# Patient Record
Sex: Male | Born: 1980 | Race: Black or African American | Hispanic: No | Marital: Single | Smoking: Current some day smoker
Health system: Southern US, Community
[De-identification: ages and names within clinical notes are randomized; demographics above are authoritative.]

## PROBLEM LIST (undated history)

## (undated) DIAGNOSIS — I10 Essential (primary) hypertension: Secondary | ICD-10-CM

---

## 2016-11-22 ENCOUNTER — Encounter (HOSPITAL_COMMUNITY): Payer: Self-pay | Admitting: Emergency Medicine

## 2016-11-22 ENCOUNTER — Emergency Department (HOSPITAL_COMMUNITY): Payer: Self-pay

## 2016-11-22 ENCOUNTER — Emergency Department (HOSPITAL_COMMUNITY)
Admission: EM | Admit: 2016-11-22 | Discharge: 2016-11-22 | Disposition: A | Discharge status: Home / Self Care | Payer: Self-pay | Attending: Emergency Medicine | Admitting: Emergency Medicine

## 2016-11-22 DIAGNOSIS — K4090 Unilateral inguinal hernia, without obstruction or gangrene, not specified as recurrent: Secondary | ICD-10-CM | POA: Insufficient documentation

## 2016-11-22 DIAGNOSIS — N50812 Left testicular pain: Secondary | ICD-10-CM

## 2016-11-22 DIAGNOSIS — N433 Hydrocele, unspecified: Secondary | ICD-10-CM | POA: Insufficient documentation

## 2016-11-22 DIAGNOSIS — I10 Essential (primary) hypertension: Secondary | ICD-10-CM | POA: Insufficient documentation

## 2016-11-22 HISTORY — DX: Essential (primary) hypertension: I10

## 2016-11-22 LAB — URINALYSIS, ROUTINE W REFLEX MICROSCOPIC
Bilirubin Urine: NEGATIVE
Glucose, UA: NEGATIVE mg/dL
KETONES UR: NEGATIVE mg/dL
Nitrite: NEGATIVE
PH: 5 (ref 5.0–8.0)
Protein, ur: 30 mg/dL — AB
Specific Gravity, Urine: 1.018 (ref 1.005–1.030)

## 2016-11-22 MED ORDER — KETOROLAC TROMETHAMINE 60 MG/2ML IM SOLN
60.0000 mg | Freq: Once | INTRAMUSCULAR | Status: DC
  Filled 2016-11-22: qty 2

## 2016-11-22 NOTE — ED Notes (Signed)
Pt walked to the bathroom to void w/o difficulty but stated he did have some scrotal pain.

## 2016-11-22 NOTE — ED Notes (Addendum)
Pt was given d/c papers w/instructions given.  He wanted to know if [facility] would take an uninsured patient.  EDP made aware and SW was called.  By the time I went back to the room to take a final set of vitals and have pt sign for d/c, he was already gone.

## 2016-11-22 NOTE — ED Triage Notes (Signed)
Pt states that he was lifting boxes yesterday and now has pain in his L groin. Alert and oriented.

## 2016-11-22 NOTE — Discharge Instructions (Signed)
You have a left inguinal hernia. You can try manually pushing the fat up into the inguinal canal. You can apply ice and elevate your scrotum. Avoid heavy lifting. Follow up with general surgery for repair. Return if unable to have a bowel movement, if vomiting, fever over 100.5, any new symptoms.

## 2016-11-22 NOTE — ED Provider Notes (Signed)
[facility]-EMERGENCY DEPT Provider Note   CSN: [id number] Arrival date & time: 11/22/16  1457     History   Chief Complaint Chief Complaint  Patient presents with  . Groin Pain    HPI Jesse Hill is a 36 y.o. male.  HPI  Jesse Hill is a 36 y.o. malewith history of hypertension, presents to emergency department complaining of left groin and left testicular pain. Patient states that he was working out yesterday, states no pain while working out, states about an hour later he developed a "twinge" in his left groin. Since then increasing pain, worse today. Patient has also noticed pain in his left testicle. He reports mild swelling. Denies any dysuria, hematuria, urinary frequency or urgency. No canal discharge. Last bowel movement yesterday and normal. No nausea or vomiting. Denies any fever or chills. Pain is worsened with movement, and nothing is making it better. No history of the same.   Past Medical History:  Diagnosis Date  . Hypertension     There are no active problems to display for this patient.   No past surgical history on file.     Home Medications    Prior to Admission medications   Not on File    Family History History reviewed. No pertinent family history.  Social History Social History  Substance Use Topics  . Smoking status: Not on file  . Smokeless tobacco: Not on file  . Alcohol use Not on file     Allergies   Patient has no known allergies.   Review of Systems Review of Systems  Constitutional: Negative for chills and fever.  Respiratory: Negative for cough, chest tightness and shortness of breath.   Cardiovascular: Negative for chest pain, palpitations and leg swelling.  Gastrointestinal: Negative for abdominal distention, abdominal pain, diarrhea, nausea and vomiting.  Genitourinary: Positive for scrotal swelling and testicular pain. Negative for dysuria, frequency, hematuria and urgency.  Musculoskeletal: Negative for arthralgias,  myalgias, neck pain and neck stiffness.  Skin: Negative for rash.  Allergic/Immunologic: Negative for immunocompromised state.  Neurological: Negative for dizziness, weakness, light-headedness, numbness and headaches.  All other systems reviewed and are negative.    Physical Exam Updated Vital Signs BP (!) 161/114 (BP Location: Left Arm)   Pulse 85   Temp 98.2 F (36.8 C) (Oral)   Resp 16   SpO2 100%   Physical Exam  Constitutional: He appears well-developed and well-nourished. No distress.  HENT:  Head: Normocephalic and atraumatic.  Eyes: Conjunctivae are normal.  Neck: Neck supple.  Cardiovascular: Normal rate, regular rhythm and normal heart sounds.   Pulmonary/Chest: Effort normal. No respiratory distress. He has no wheezes. He has no rales.  Abdominal: Soft. Bowel sounds are normal. He exhibits no distension. There is tenderness. There is no rebound.  LLQ/ inguinal tenderness. Pain with left leg raise. No obvious swelling, hernias.  Genitourinary:  Genitourinary Comments: Mild left testicular tenderness.   Musculoskeletal: He exhibits no edema.  Neurological: He is alert.  Skin: Skin is warm and dry.  Nursing note and vitals reviewed.    ED Treatments / Results  Labs (all labs ordered are listed, but only abnormal results are displayed) Labs Reviewed  URINALYSIS, ROUTINE W REFLEX MICROSCOPIC - Abnormal; Notable for the following:       Result Value   APPearance CLOUDY (*)    Hgb urine dipstick MODERATE (*)    Protein, ur 30 (*)    Leukocytes, UA LARGE (*)    Bacteria, UA RARE (*)  Squamous Epithelial / LPF 0-5 (*)    All other components within normal limits  URINE CULTURE  GC/CHLAMYDIA PROBE AMP ([facility]) NOT AT [facility]    EKG  EKG Interpretation None       Radiology US Scrotum  Result Date: 11/22/2016 CLINICAL DATA:  36 year old male with acute left testicular pain EXAM: SCROTAL ULTRASOUND DOPPLER ULTRASOUND OF THE TESTICLES TECHNIQUE: Complete  ultrasound examination of the testicles, epididymis, and other scrotal structures was performed. Color and spectral Doppler ultrasound were also utilized to evaluate blood flow to the testicles. COMPARISON:  None. FINDINGS: Right testicle Measurements: 3.8 x 1.8 x 2.7 cm. No mass or microlithiasis visualized. Left testicle Measurements: 3.7 x 2.3 x 2.5 cm. No mass or microlithiasis visualized. A left inguinal hernia containing a moderate amount of fat noted. No definite bowel identified. Right epididymis:  Normal in size and appearance. Left epididymis:  Normal in size and appearance. Hydrocele:  A moderate left hydrocele is noted. Varicocele:  None visualized. Pulsed Doppler interrogation of both testes demonstrates normal low resistance arterial and venous waveforms bilaterally. IMPRESSION: 1. Normal testicles bilaterally. No evidence of testicular torsion or mass. 2. Left inguinal hernia containing moderate amount of fat. No definite herniated bowel. Moderate left hydrocele. Electronically Signed   By: Harmon Pier M.D.   On: 11/22/2016 18:42   US Pelvic Doppler (torsion R/o Or Mass Arterial Flow)  Result Date: 11/22/2016 CLINICAL DATA:  36 year old male with acute left testicular pain EXAM: SCROTAL ULTRASOUND DOPPLER ULTRASOUND OF THE TESTICLES TECHNIQUE: Complete ultrasound examination of the testicles, epididymis, and other scrotal structures was performed. Color and spectral Doppler ultrasound were also utilized to evaluate blood flow to the testicles. COMPARISON:  None. FINDINGS: Right testicle Measurements: 3.8 x 1.8 x 2.7 cm. No mass or microlithiasis visualized. Left testicle Measurements: 3.7 x 2.3 x 2.5 cm. No mass or microlithiasis visualized. A left inguinal hernia containing a moderate amount of fat noted. No definite bowel identified. Right epididymis:  Normal in size and appearance. Left epididymis:  Normal in size and appearance. Hydrocele:  A moderate left hydrocele is noted. Varicocele:  None  visualized. Pulsed Doppler interrogation of both testes demonstrates normal low resistance arterial and venous waveforms bilaterally. IMPRESSION: 1. Normal testicles bilaterally. No evidence of testicular torsion or mass. 2. Left inguinal hernia containing moderate amount of fat. No definite herniated bowel. Moderate left hydrocele. Electronically Signed   By: Harmon Pier M.D.   On: 11/22/2016 18:42    Procedures Procedures (including critical care time)  Medications Ordered in ED Medications - No data to display   Initial Impression / Assessment and Plan / ED Course  I have reviewed the triage vital signs and the nursing notes.  Pertinent labs & imaging results that were available during my care of the patient were reviewed by me and considered in my medical decision making (see chart for details).     Pt with left inguinal and testicular pain. Muscle strain vs hernia vs epididymitis. Will check UA and get US scrotum.   7:25 PM Patient's urinalysis showing too numerous to count white blood cells, large leukocytes, rare bacteria, patient denies any urinary symptoms. We'll send cultures and GC chlamydia cultures. Ultrasound of the scrotum showed left inguinal hernia containing moderate amount of fat, no herniated bowel seen. Moderate left hydrocele. No evidence of torsion or epididymitis at this time.patient unable to tolerate me reducing the fatty tissue from hernia. Offered pain medications, did not want anything strong, and refused toradol.  Afebrile here. No bowel movement today, however, normal bowel sounds, and abdomen non tender, pt has had no vomiting. Doubt strangulated hernia at this time. Hypertensive otherwise normal bowel sounds.   8:12 PM Pt still in a lot of pain and concerned that he wont be able to follow up with general surgery bc has no insurance. I discussed with Child psychotherapist and will send case management a note for follow up. I was able to also reduce fat hernia and showed  pt how to do it. Pt felt much better after reduction, pain was gone. Will dc home with follow up.   Vitals:   11/22/16 1503 11/22/16 1800  BP: (!) 161/114 (!) 162/100  Pulse: 85 83  Resp: 16 18  Temp: 98.2 F (36.8 C)   TempSrc: Oral   SpO2: 100% 100%     Final Clinical Impressions(s) / ED Diagnoses   Final diagnoses:  Testicular pain, left  Non-recurrent unilateral inguinal hernia without obstruction or gangrene  Hydrocele, left    New Prescriptions New Prescriptions   No medications on file     Jaynie Crumble, Cordelia Poche 11/22/16 2014    Gwyneth Sprout, MD 11/23/16 [phone]

## 2016-11-22 NOTE — ED Notes (Signed)
Contacted SW regarding pt being uninsured and needing surgical consult as requested by pt prior to d/c to home.

## 2016-11-23 ENCOUNTER — Telehealth: Payer: Self-pay | Admitting: Emergency Medicine

## 2016-11-23 NOTE — Telephone Encounter (Signed)
CM consulted for no ins no PCP and need for surgery follow up.  Called phone number in the computer and it is not in service at this time.  Will attempt to find other phone numbers or wait for pt's return to the ED for follow up.

## 2016-11-24 LAB — URINE CULTURE: Culture: NO GROWTH

## 2016-11-25 ENCOUNTER — Encounter (HOSPITAL_COMMUNITY): Payer: Self-pay | Admitting: *Deleted

## 2016-11-25 ENCOUNTER — Emergency Department (HOSPITAL_COMMUNITY): Payer: Self-pay

## 2016-11-25 ENCOUNTER — Emergency Department (HOSPITAL_COMMUNITY)
Admission: EM | Admit: 2016-11-25 | Discharge: 2016-11-25 | Disposition: A | Discharge status: Home / Self Care | Payer: Self-pay | Attending: Emergency Medicine | Admitting: Emergency Medicine

## 2016-11-25 DIAGNOSIS — R1032 Left lower quadrant pain: Secondary | ICD-10-CM | POA: Insufficient documentation

## 2016-11-25 DIAGNOSIS — N5082 Scrotal pain: Secondary | ICD-10-CM | POA: Insufficient documentation

## 2016-11-25 DIAGNOSIS — Z79899 Other long term (current) drug therapy: Secondary | ICD-10-CM | POA: Insufficient documentation

## 2016-11-25 DIAGNOSIS — N453 Epididymo-orchitis: Secondary | ICD-10-CM | POA: Insufficient documentation

## 2016-11-25 DIAGNOSIS — I10 Essential (primary) hypertension: Secondary | ICD-10-CM | POA: Insufficient documentation

## 2016-11-25 DIAGNOSIS — R300 Dysuria: Secondary | ICD-10-CM | POA: Insufficient documentation

## 2016-11-25 DIAGNOSIS — R350 Frequency of micturition: Secondary | ICD-10-CM | POA: Insufficient documentation

## 2016-11-25 DIAGNOSIS — F17210 Nicotine dependence, cigarettes, uncomplicated: Secondary | ICD-10-CM | POA: Insufficient documentation

## 2016-11-25 DIAGNOSIS — N5089 Other specified disorders of the male genital organs: Secondary | ICD-10-CM

## 2016-11-25 DIAGNOSIS — R3915 Urgency of urination: Secondary | ICD-10-CM | POA: Insufficient documentation

## 2016-11-25 DIAGNOSIS — K5900 Constipation, unspecified: Secondary | ICD-10-CM | POA: Insufficient documentation

## 2016-11-25 LAB — CBC
HCT: 38.2 % — ABNORMAL LOW (ref 39.0–52.0)
Hemoglobin: 13.2 g/dL (ref 13.0–17.0)
MCH: 29.5 pg (ref 26.0–34.0)
MCHC: 34.6 g/dL (ref 30.0–36.0)
MCV: 85.3 fL (ref 78.0–100.0)
PLATELETS: 261 10*3/uL (ref 150–400)
RBC: 4.48 MIL/uL (ref 4.22–5.81)
RDW: 12.9 % (ref 11.5–15.5)
WBC: 15 10*3/uL — AB (ref 4.0–10.5)

## 2016-11-25 LAB — COMPREHENSIVE METABOLIC PANEL
ALT: 85 U/L — AB (ref 17–63)
AST: 59 U/L — AB (ref 15–41)
Albumin: 3.9 g/dL (ref 3.5–5.0)
Alkaline Phosphatase: 78 U/L (ref 38–126)
Anion gap: 9 (ref 5–15)
BILIRUBIN TOTAL: 0.6 mg/dL (ref 0.3–1.2)
BUN: 14 mg/dL (ref 6–20)
CO2: 24 mmol/L (ref 22–32)
CREATININE: 1.01 mg/dL (ref 0.61–1.24)
Calcium: 9.5 mg/dL (ref 8.9–10.3)
Chloride: 107 mmol/L (ref 101–111)
Glucose, Bld: 103 mg/dL — ABNORMAL HIGH (ref 65–99)
Potassium: 3.9 mmol/L (ref 3.5–5.1)
Sodium: 140 mmol/L (ref 135–145)
TOTAL PROTEIN: 8.4 g/dL — AB (ref 6.5–8.1)

## 2016-11-25 LAB — URINALYSIS, ROUTINE W REFLEX MICROSCOPIC
BILIRUBIN URINE: NEGATIVE
GLUCOSE, UA: NEGATIVE mg/dL
Ketones, ur: 5 mg/dL — AB
NITRITE: NEGATIVE
PH: 6 (ref 5.0–8.0)
Protein, ur: 30 mg/dL — AB
SPECIFIC GRAVITY, URINE: 1.021 (ref 1.005–1.030)
Squamous Epithelial / LPF: NONE SEEN

## 2016-11-25 LAB — LIPASE, BLOOD: Lipase: 29 U/L (ref 11–51)

## 2016-11-25 LAB — GC/CHLAMYDIA PROBE AMP (~~LOC~~) NOT AT ARMC
Chlamydia: NEGATIVE
Neisseria Gonorrhea: NEGATIVE

## 2016-11-25 MED ORDER — KETOROLAC TROMETHAMINE 60 MG/2ML IM SOLN
60.0000 mg | Freq: Once | INTRAMUSCULAR | Status: AC
  Administered 2016-11-25: 60 mg via INTRAMUSCULAR
  Filled 2016-11-25: qty 2

## 2016-11-25 MED ORDER — LEVOFLOXACIN 500 MG PO TABS: 500.0000 mg | ORAL_TABLET | Freq: Every day | ORAL | 0 refills | Status: AC

## 2016-11-25 NOTE — ED Triage Notes (Signed)
Pt reports that [facility] was [facility] on Monday and diagnosed with a hernia. Pt reports swelling in the left side of his scrotum. Pt reports he has not has a bowel movement since Sunday. Pt sates that pain suddenly worsened today.

## 2016-11-25 NOTE — ED Provider Notes (Signed)
[facility]-EMERGENCY DEPT Provider Note   CSN: [id number] Arrival date & time: 11/25/16  1351     History   Chief Complaint Chief Complaint  Patient presents with  . Hernia    HPI Jesse Hill is a 36 y.o. male.  This is a 36 year old male with PMH of HTN who presents 3 days after prior ED visit with continued left testicular and scrotal pain. 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  He stated he was having a regular workday until suddenly around 1030 this morning he began having left testicular pain suddenly and could not work any longer.  He presented to the ED for further evaluation. Patient was evaluated at [facility] for same complaint 3 days prior.  He had a ultrasound performed along with infectious workup including urine, urine culture, GC probe. Patient has not had a bowel movement since Sunday.  He denies any blood in his stool previously, continues to have flatus.  Denies any nausea or vomiting, dyspnea, chest pain. He endorses left-sided LLQ abdominal pain. At that time the ultrasound showed evidence of a fat-containing hernia and left hydrocele, which was reduced at bedside.  The patient also endorses dysuria, urinary frequency and urgency.  He states the symptoms are new and began today.  He states his pain is worse with movement although he denies any fevers or chills or change in appetite.    The history is provided by the patient, the spouse and medical records.    Past Medical History:  Diagnosis Date  . Hypertension     There are no active problems to display for this patient.   History reviewed. No pertinent surgical history.     Home Medications    Prior to Admission medications   Medication Sig Start Date End Date Taking? Authorizing Provider  hydrochlorothiazide (HYDRODIURIL) 25 MG tablet Take 25 mg by mouth daily.   Yes [provider]  levofloxacin (LEVAQUIN) 500 MG tablet Take 1 tablet (500 mg total) by  mouth daily. 11/25/16 12/05/16  Shaune Pollack, MD    Family History No family history on file.  Social History Social History  Substance Use Topics  . Smoking status: Current Some Day Smoker    Packs/day: 0.50    Types: Cigarettes  . Smokeless tobacco: Not on file  . Alcohol use Not on file     Allergies   Patient has no known allergies.   Review of Systems Review of Systems  Constitutional: Negative for appetite change, chills, diaphoresis, fever and unexpected weight change.  HENT: Negative for ear pain and sore throat.   Eyes: Negative for pain and visual disturbance.  Respiratory: Negative for cough and shortness of breath.   Cardiovascular: Negative for chest pain and palpitations.  Gastrointestinal: Positive for constipation. Negative for abdominal pain, blood in stool, diarrhea and vomiting.  Genitourinary: Positive for difficulty urinating, dysuria, frequency, scrotal swelling and testicular pain. Negative for discharge, flank pain, genital sores, hematuria, penile pain and penile swelling.  Musculoskeletal: Negative for arthralgias, back pain, gait problem, joint swelling, myalgias, neck pain and neck stiffness.  Skin: Negative for color change and rash.  Neurological: Negative for seizures and syncope.  All other systems reviewed and are negative.    Physical Exam Updated Vital Signs BP (!) 137/92   Pulse 82   Temp 98.1 F (36.7 C) (Oral)   Resp 18   Ht  (1.549 m)   Wt 79.4 kg (175 lb)  SpO2 99%   BMI 33.07 kg/m   Physical Exam  Constitutional: He appears well-developed and well-nourished.  HENT:  Head: Normocephalic and atraumatic.  Eyes: Conjunctivae are normal.  Neck: Neck supple.  Cardiovascular: Normal rate and regular rhythm.   No murmur heard. Pulmonary/Chest: Effort normal and breath sounds normal. No respiratory distress.  Abdominal: Soft. There is no tenderness. Hernia confirmed negative in the right inguinal area and confirmed  negative in the left inguinal area.  Genitourinary: Penis normal. Cremasteric reflex is present. Right testis shows no mass, no swelling and no tenderness. Left testis shows mass, swelling and tenderness. Circumcised. No phimosis, penile erythema or penile tenderness. No discharge found.  Genitourinary Comments: Point tenderness at the testicle with swelling noted on the left. Right nontender.  Musculoskeletal: He exhibits no edema.  Lymphadenopathy: No inguinal adenopathy noted on the right or left side.  Neurological: He is alert.  Skin: Skin is warm and dry.  Psychiatric: He has a normal mood and affect.  Nursing note and vitals reviewed.    ED Treatments / Results  Labs (all labs ordered are listed, but only abnormal results are displayed) Labs Reviewed  COMPREHENSIVE METABOLIC PANEL - Abnormal; Notable for the following:       Result Value   Glucose, Bld 103 (*)    Total Protein 8.4 (*)    AST 59 (*)    ALT 85 (*)    All other components within normal limits  CBC - Abnormal; Notable for the following:    WBC 15.0 (*)    HCT 38.2 (*)    All other components within normal limits  URINALYSIS, ROUTINE W REFLEX MICROSCOPIC - Abnormal; Notable for the following:    Color, Urine AMBER (*)    Hgb urine dipstick SMALL (*)    Ketones, ur 5 (*)    Protein, ur 30 (*)    Leukocytes, UA MODERATE (*)    Bacteria, UA RARE (*)    All other components within normal limits  LIPASE, BLOOD    EKG  EKG Interpretation None       Radiology US Scrotum  Result Date: 11/25/2016 CLINICAL DATA:  Testicular pain, re-evaluation of LEFT scrotal swelling since Saturday EXAM: SCROTAL ULTRASOUND DOPPLER ULTRASOUND OF THE TESTICLES TECHNIQUE: Complete ultrasound examination of the testicles, epididymis, and other scrotal structures was performed. Color and spectral Doppler ultrasound were also utilized to evaluate blood flow to the testicles. COMPARISON:  11/22/2016 FINDINGS: Right testicle  Measurements: 4.0 x 2.3 x 3.2 cm. Normal morphology without mass or calcification. Internal blood flow present on color Doppler imaging, hypervascular versus LEFT. Left testicle Measurements: 3.6 x 1.9 x 3.5 cm. Normal morphology without mass or calcification. Internal blood flow present on color Doppler imaging. Right epididymis: Enlarged, heterogeneous and hypervascular versus LEFT epididymis and corresponding to an area of pain. No epididymal mass. Left epididymis:  Normal in size and appearance. Hydrocele: Complicated RIGHT hydrocele containing multiple septations and scattered internal echogenicity. No LEFT hydrocele. Varicocele: Markedly hypervascular RIGHT inguinal canal cannot exclude varicocele though this could reflect hypervascularity and slight prominence the vessels due to the marked inflammatory process in the RIGHT hemiscrotum. Pulsed Doppler interrogation of both testes demonstrates normal low resistance arterial and venous waveforms bilaterally. IMPRESSION: Markedly hypervascular RIGHT epididymis and RIGHT testis compatible with epididymo-orchitis. Mildly complicated RIGHT hydrocele, overall smaller in size but increased in complexity since previous study. No definite evidence of testicular torsion or mass. Electronically Signed   By: Angelyn Punt.D.  On: 11/25/2016 17:59   [place] Scrotom Doppler  Result Date: 11/25/2016 CLINICAL DATA:  Testicular pain, re-evaluation of LEFT scrotal swelling since Saturday EXAM: SCROTAL ULTRASOUND DOPPLER ULTRASOUND OF THE TESTICLES TECHNIQUE: Complete ultrasound examination of the testicles, epididymis, and other scrotal structures was performed. Color and spectral Doppler ultrasound were also utilized to evaluate blood flow to the testicles. COMPARISON:  11/22/2016 FINDINGS: Right testicle Measurements: 4.0 x 2.3 x 3.2 cm. Normal morphology without mass or calcification. Internal blood flow present on color Doppler imaging, hypervascular versus LEFT. Left  testicle Measurements: 3.6 x 1.9 x 3.5 cm. Normal morphology without mass or calcification. Internal blood flow present on color Doppler imaging. Right epididymis: Enlarged, heterogeneous and hypervascular versus LEFT epididymis and corresponding to an area of pain. No epididymal mass. Left epididymis:  Normal in size and appearance. Hydrocele: Complicated RIGHT hydrocele containing multiple septations and scattered internal echogenicity. No LEFT hydrocele. Varicocele: Markedly hypervascular RIGHT inguinal canal cannot exclude varicocele though this could reflect hypervascularity and slight prominence the vessels due to the marked inflammatory process in the RIGHT hemiscrotum. Pulsed Doppler interrogation of both testes demonstrates normal low resistance arterial and venous waveforms bilaterally. IMPRESSION: Markedly hypervascular RIGHT epididymis and RIGHT testis compatible with epididymo-orchitis. Mildly complicated RIGHT hydrocele, overall smaller in size but increased in complexity since previous study. No definite evidence of testicular torsion or mass. Electronically Signed   By: Ulyses SouthwardMark  Boles M.D.   On: 11/25/2016 17:59    Procedures Procedures (including critical care time)  Medications Ordered in ED Medications  ketorolac (TORADOL) injection 60 mg (not administered)     Initial Impression / Assessment and Plan / ED Course  I have reviewed the triage vital signs and the nursing notes.  Pertinent labs & imaging results that were available during my care of the patient were reviewed by me and considered in my medical decision making (see chart for details).     This is a [age] year old male with PMH of HTN who presents 3 days after prior ED visit with continued left testicular and scrotal pain.  Ultrasound report from 3 days prior reviewed which showed evidence of normal testicles bilaterally and no evidence of torsion or mass.  There is a left inguinal hernia containing fat with no definite  herniated bowel at that time. He also had a moderate left hydrocele.  GC amplification negative, urinalysis showed vague leukocytes and bacteria however no obvious sign of urinary infection.  Urine culture was also negative after 3 days.  On exam patient has focal testicular pain on left side, minimal increased swelling of the left scrotum.  No obvious hernia palpated. Right scorum without pain on palpation. +Cremasteric reflex present bilaterally.  On evaluation today patient has leukocytosis with WBC count of 15. Toradol given for pain.  Urine sample collected, repeat scrotal ultrasound. US concerning for right Epididymitis/orchitis.  Patient symptoms and physical exam findings on the left testicle which does not correspond with ultrasound.  Possibly labeling error, but given symptoms will treat regardless.  Patient meets outpatient therapy, will prescribe antibiotic coverage for E coli and Pseudomonas given negative GC cultures.  Appropriate follow up scheduled as patient has no PCP. Return precautions given, all questions answered. Advised patient not to take his partner's Norco as this is both illegal and not safe for the patient.  Final Clinical Impressions(s) / ED Diagnoses   Final diagnoses:  Scrotal pain  Epididymoorchitis    New Prescriptions New Prescriptions   LEVOFLOXACIN (LEVAQUIN) 500 MG TABLET  Take 1 tablet (500 mg total) by mouth daily.     Shaune Pollack, MD 11/25/16 [phone]

## 2016-11-25 NOTE — ED Provider Notes (Signed)
I have personally seen and examined the patient. I have reviewed the documentation on PMH/FH/Soc Hx. I have discussed the plan of care with the resident and patient.  I have reviewed and agree with the resident's documentation. Please see associated encounter note.   EKG Interpretation None         Bintou Lafata, Amadeo GarnetPedro Eduardo, MD 11/25/16 [phone]

## 2016-11-25 NOTE — ED Notes (Signed)
Pt stable, ambulatory, states understanding of discharge instructions 

## 2017-01-28 ENCOUNTER — Ambulatory Visit (HOSPITAL_COMMUNITY)
Admission: EM | Admit: 2017-01-28 | Discharge: 2017-01-28 | Disposition: A | Discharge status: Home / Self Care | Payer: Self-pay | Attending: Internal Medicine | Admitting: Internal Medicine

## 2017-01-28 ENCOUNTER — Encounter (HOSPITAL_COMMUNITY): Payer: Self-pay | Admitting: Emergency Medicine

## 2017-01-28 DIAGNOSIS — Z79899 Other long term (current) drug therapy: Secondary | ICD-10-CM | POA: Insufficient documentation

## 2017-01-28 DIAGNOSIS — R369 Urethral discharge, unspecified: Secondary | ICD-10-CM | POA: Insufficient documentation

## 2017-01-28 DIAGNOSIS — I10 Essential (primary) hypertension: Secondary | ICD-10-CM | POA: Insufficient documentation

## 2017-01-28 DIAGNOSIS — F17210 Nicotine dependence, cigarettes, uncomplicated: Secondary | ICD-10-CM | POA: Insufficient documentation

## 2017-01-28 DIAGNOSIS — Z202 Contact with and (suspected) exposure to infections with a predominantly sexual mode of transmission: Secondary | ICD-10-CM | POA: Insufficient documentation

## 2017-01-28 MED ORDER — AZITHROMYCIN 250 MG PO TABS
1000.0000 mg | ORAL_TABLET | Freq: Once | ORAL | Status: AC
  Administered 2017-01-28: 1000 mg via ORAL

## 2017-01-28 MED ORDER — HYDROCHLOROTHIAZIDE 25 MG PO TABS: 25.0000 mg | ORAL_TABLET | Freq: Every day | ORAL | 3 refills | Status: DC

## 2017-01-28 MED ORDER — CEFTRIAXONE SODIUM 250 MG IJ SOLR
INTRAMUSCULAR | Status: AC
  Filled 2017-01-28: qty 250

## 2017-01-28 MED ORDER — AZITHROMYCIN 250 MG PO TABS
ORAL_TABLET | ORAL | Status: AC
  Filled 2017-01-28: qty 4

## 2017-01-28 MED ORDER — CEFTRIAXONE SODIUM 250 MG IJ SOLR
250.0000 mg | Freq: Once | INTRAMUSCULAR | Status: AC
  Administered 2017-01-28: 250 mg via INTRAMUSCULAR

## 2017-01-28 NOTE — ED Provider Notes (Addendum)
[facility]    CSN: [id number] Arrival date & time: 01/28/17  [id number]     History   Chief Complaint Chief Complaint  Patient presents with  . Exposure to STD    HPI Jesse Hill is a 36 y.o. male.   Presenting today for treatment for gonorrhea as his girlfriend was tested positive for gonorrhea on 11/03. Patient reports white penile discharge for 2 days. He denies fever. He denies penile rash/pain or testicular pain. He reports 1 partner.     Past Medical History:  Diagnosis Date  . Hypertension     There are no active problems to display for this patient.   History reviewed. No pertinent surgical history.    Home Medications    Prior to Admission medications   Medication Sig Start Date End Date Taking? Authorizing Provider  hydrochlorothiazide (HYDRODIURIL) 25 MG tablet Take 1 tablet (25 mg total) daily by mouth. 01/28/17 02/27/17  Lucia EstelleZheng, Nyzaiah Kai, NP    Family History History reviewed. No pertinent family history.  Social History Social History   Tobacco Use  . Smoking status: Current Some Day Smoker    Packs/day: 0.50    Types: Cigarettes  . Smokeless tobacco: Never Used  Substance Use Topics  . Alcohol use: No    Frequency: Never  . Drug use: No     Allergies   Patient has no known allergies.   Review of Systems Review of Systems  Constitutional:       See HPI     Physical Exam Triage Vital Signs ED Triage Vitals  Enc Vitals Group     BP      Pulse      Resp      Temp      Temp src      SpO2      Weight      Height      Head Circumference      Peak Flow      Pain Score      Pain Loc      Pain Edu?      Excl. in GC?    No data found.  Updated Vital Signs BP (!) 142/97 (BP Location: Left Arm)   Pulse (!) 101   Temp 99.1 F (37.3 C) (Oral)   Resp 20   SpO2 98%   Physical Exam  Constitutional: He is oriented to person, place, and time. He appears well-developed and well-nourished.  Cardiovascular: Normal rate,  regular rhythm and normal heart sounds.  Pulmonary/Chest: Effort normal and breath sounds normal.  Genitourinary:  Genitourinary Comments: Penis without rash or lesion. Some discharged noted. No tenderness to palpate. Testicles normal bilaterally.   Neurological: He is alert and oriented to person, place, and time.  Nursing note and vitals reviewed.    UC Treatments / Results  Labs (all labs ordered are listed, but only abnormal results are displayed) Labs Reviewed  URINE CYTOLOGY ANCILLARY ONLY    EKG  EKG Interpretation None       Radiology No results found.  Procedures Procedures (including critical care time)  Medications Ordered in UC Medications  cefTRIAXone (ROCEPHIN) injection 250 mg (not administered)  azithromycin (ZITHROMAX) tablet 1,000 mg (not administered)     Initial Impression / Assessment and Plan / UC Course  I have reviewed the triage vital signs and the nursing notes.  Pertinent labs & imaging results that were available during my care of the patient were reviewed by me and  considered in my medical decision making (see chart for details).   Final Clinical Impressions(s) / UC Diagnoses   Final diagnoses:  STD exposure  Essential hypertension   36 y.o. Male, recently exposed to gonorrhea, here for testing and treatment. 1g azithromycin and 250 mg Rocephin given. Urine cytology pending. Educated on safe sex. Discharge paperwork given.   BP noted to be elevated, does have hx of HTN. Patient states that he ran out of his HCTZ for a long time. HCTZ refilled today. Advised to f/u with a PCP.   ED Discharge Orders        Ordered    hydrochlorothiazide (HYDRODIURIL) 25 MG tablet  Daily     01/28/17 1031     Controlled Substance Prescriptions [place] Controlled Substance Registry consulted? Not Applicable      Lucia EstelleZheng, Kallie Depolo, NP 01/28/17 [phone]

## 2017-01-28 NOTE — ED Triage Notes (Signed)
Pt is here for STD  Reports partner who is being seen is being treated for Gon... Mult partners... Condom broke w/on of his partners.   Sx today include: penile d/c and dysuria x2 days  A&O x4... NAD... Ambulatory

## 2017-01-28 NOTE — Discharge Instructions (Signed)
Please get in with a family doctor to have  your blood pressure managed.

## 2017-01-31 LAB — URINE CYTOLOGY ANCILLARY ONLY
Chlamydia: NEGATIVE
Neisseria Gonorrhea: POSITIVE — AB
Trichomonas: NEGATIVE

## 2017-10-18 ENCOUNTER — Emergency Department (HOSPITAL_COMMUNITY): Payer: Self-pay

## 2017-10-18 ENCOUNTER — Encounter (HOSPITAL_COMMUNITY): Payer: Self-pay | Admitting: Emergency Medicine

## 2017-10-18 ENCOUNTER — Emergency Department (HOSPITAL_COMMUNITY)
Admission: EM | Admit: 2017-10-18 | Discharge: 2017-10-18 | Disposition: A | Discharge status: Home / Self Care | Payer: Self-pay | Attending: Emergency Medicine | Admitting: Emergency Medicine

## 2017-10-18 ENCOUNTER — Other Ambulatory Visit: Payer: Self-pay

## 2017-10-18 DIAGNOSIS — Y92810 Car as the place of occurrence of the external cause: Secondary | ICD-10-CM | POA: Insufficient documentation

## 2017-10-18 DIAGNOSIS — I10 Essential (primary) hypertension: Secondary | ICD-10-CM | POA: Insufficient documentation

## 2017-10-18 DIAGNOSIS — S60221A Contusion of right hand, initial encounter: Secondary | ICD-10-CM | POA: Insufficient documentation

## 2017-10-18 DIAGNOSIS — Y998 Other external cause status: Secondary | ICD-10-CM | POA: Insufficient documentation

## 2017-10-18 DIAGNOSIS — Y9389 Activity, other specified: Secondary | ICD-10-CM | POA: Insufficient documentation

## 2017-10-18 DIAGNOSIS — F17210 Nicotine dependence, cigarettes, uncomplicated: Secondary | ICD-10-CM | POA: Insufficient documentation

## 2017-10-18 DIAGNOSIS — W228XXA Striking against or struck by other objects, initial encounter: Secondary | ICD-10-CM | POA: Insufficient documentation

## 2017-10-18 MED ORDER — KETOROLAC TROMETHAMINE 30 MG/ML IJ SOLN
30.0000 mg | Freq: Once | INTRAMUSCULAR | Status: AC
  Administered 2017-10-18: 30 mg via INTRAMUSCULAR
  Filled 2017-10-18: qty 1

## 2017-10-18 MED ORDER — NAPROXEN 500 MG PO TABS
500.0000 mg | ORAL_TABLET | Freq: Two times a day (BID) | ORAL | 0 refills | Status: DC
Start: 2017-10-18 — End: 2018-11-02

## 2017-10-18 NOTE — Discharge Instructions (Signed)
Return to ED for worsening symptoms, red hot or tender joints, additional injuries or falls, numbness in your hand.

## 2017-10-18 NOTE — ED Notes (Signed)
Patient able to ambulate independently  
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
## 2018-04-25 ENCOUNTER — Emergency Department (HOSPITAL_COMMUNITY)
Admission: EM | Admit: 2018-04-25 | Discharge: 2018-04-26 | Disposition: A | Discharge status: Home / Self Care | Payer: Self-pay | Attending: Emergency Medicine | Admitting: Emergency Medicine

## 2018-04-25 ENCOUNTER — Other Ambulatory Visit: Payer: Self-pay

## 2018-04-25 ENCOUNTER — Encounter (HOSPITAL_COMMUNITY): Payer: Self-pay | Admitting: Emergency Medicine

## 2018-04-25 DIAGNOSIS — J181 Lobar pneumonia, unspecified organism: Secondary | ICD-10-CM

## 2018-04-25 DIAGNOSIS — F17210 Nicotine dependence, cigarettes, uncomplicated: Secondary | ICD-10-CM | POA: Insufficient documentation

## 2018-04-25 DIAGNOSIS — R0981 Nasal congestion: Secondary | ICD-10-CM | POA: Insufficient documentation

## 2018-04-25 DIAGNOSIS — J189 Pneumonia, unspecified organism: Secondary | ICD-10-CM | POA: Insufficient documentation

## 2018-04-25 DIAGNOSIS — R05 Cough: Secondary | ICD-10-CM | POA: Insufficient documentation

## 2018-04-25 DIAGNOSIS — R509 Fever, unspecified: Secondary | ICD-10-CM | POA: Insufficient documentation

## 2018-04-25 MED ORDER — ACETAMINOPHEN 325 MG PO TABS
650.0000 mg | ORAL_TABLET | Freq: Once | ORAL | Status: AC | PRN
  Administered 2018-04-25: 650 mg via ORAL
  Filled 2018-04-25: qty 2

## 2018-04-25 NOTE — ED Triage Notes (Signed)
C/o productive cough with green phlegm, congestion, fever, and generalized body aches x 2 days.

## 2018-04-26 ENCOUNTER — Emergency Department (HOSPITAL_COMMUNITY): Payer: Self-pay

## 2018-04-26 MED ORDER — LEVOFLOXACIN 750 MG PO TABS
750.0000 mg | ORAL_TABLET | Freq: Once | ORAL | Status: AC
  Administered 2018-04-26: 750 mg via ORAL
  Filled 2018-04-26: qty 1

## 2018-04-26 MED ORDER — BENZONATATE 100 MG PO CAPS: 100.0000 mg | ORAL_CAPSULE | Freq: Three times a day (TID) | ORAL | 0 refills | Status: DC | PRN

## 2018-04-26 MED ORDER — ALBUTEROL SULFATE HFA 108 (90 BASE) MCG/ACT IN AERS
2.0000 | INHALATION_SPRAY | Freq: Once | RESPIRATORY_TRACT | Status: AC
  Administered 2018-04-26: 2 via RESPIRATORY_TRACT
  Filled 2018-04-26: qty 6.7

## 2018-04-26 MED ORDER — LEVOFLOXACIN 750 MG PO TABS: 750.0000 mg | ORAL_TABLET | Freq: Every day | ORAL | 0 refills | Status: DC

## 2018-04-26 MED ORDER — IBUPROFEN 800 MG PO TABS
800.0000 mg | ORAL_TABLET | Freq: Once | ORAL | Status: AC
  Administered 2018-04-26: 800 mg via ORAL
  Filled 2018-04-26: qty 1

## 2018-04-26 MED ORDER — IPRATROPIUM-ALBUTEROL 0.5-2.5 (3) MG/3ML IN SOLN
3.0000 mL | Freq: Once | RESPIRATORY_TRACT | Status: AC
  Administered 2018-04-26: 3 mL via RESPIRATORY_TRACT
  Filled 2018-04-26: qty 3

## 2018-04-26 NOTE — ED Provider Notes (Signed)
[facility] EMERGENCY DEPARTMENT Provider Note   CSN: [id number] Arrival date & time: 04/25/18  2010     History   Chief Complaint Chief Complaint  Patient presents with  . flu symptoms    HPI Jesse Hill is a 38 y.o. male.   38 y/o male with hx of HTN presents to the ED for evaluation of body aches x2 days with associated cough.  Cough has been productive of green phlegm. Patient also noting congestion, subjective fever. Fever of 102.27F on arrival. He has been taking Theraflu without relief. He has been around a friend with similar symptoms. No V/D, abdominal pain, chest pain.  The history is provided by the patient. No language interpreter was used.    Past Medical History:  Diagnosis Date  . Hypertension     There are no active problems to display for this patient.   History reviewed. No pertinent surgical history.      Home Medications    Prior to Admission medications   Medication Sig Start Date End Date Taking? Authorizing Provider  benzonatate (TESSALON) 100 MG capsule Take 1 capsule (100 mg total) by mouth 3 (three) times daily as needed for cough. 04/26/18   Antony Madura, PA-C  hydrochlorothiazide (HYDRODIURIL) 25 MG tablet Take 1 tablet (25 mg total) daily by mouth. 01/28/17 02/27/17  Lucia Estelle, NP  levofloxacin (LEVAQUIN) 750 MG tablet Take 1 tablet (750 mg total) by mouth daily. Start on the morning of 04/27/17 and take as prescribed until finished 04/26/18   Antony Madura, PA-C  naproxen (NAPROSYN) 500 MG tablet Take 1 tablet (500 mg total) by mouth 2 (two) times daily. 10/18/17   Dietrich Pates, PA-C    Family History No family history on file.  Social History Social History   Tobacco Use  . Smoking status: Current Some Day Smoker    Packs/day: 0.50    Types: Cigarettes  . Smokeless tobacco: Never Used  Substance Use Topics  . Alcohol use: No    Frequency: Never  . Drug use: No     Allergies   Patient has no known  allergies.   Review of Systems Review of Systems Ten systems reviewed and are negative for acute change, except as noted in the HPI.    Physical Exam Updated Vital Signs BP 105/72 (BP Location: Right Arm)   Pulse 74   Temp 98.4 F (36.9 C) (Oral)   Resp 16   SpO2 96%   Physical Exam Vitals signs and nursing note reviewed.  Constitutional:      General: He is not in acute distress.    Appearance: He is well-developed. He is not diaphoretic.     Comments: Nontoxic appearing and in NAD  HENT:     Head: Normocephalic and atraumatic.  Eyes:     General: No scleral icterus.    Conjunctiva/sclera: Conjunctivae normal.  Neck:     Musculoskeletal: Normal range of motion.  Cardiovascular:     Rate and Rhythm: Normal rate and regular rhythm.     Pulses: Normal pulses.  Pulmonary:     Effort: Pulmonary effort is normal. No respiratory distress.     Breath sounds: No stridor. No wheezing or rales.     Comments: Dry, nonproductive cough. Respirations even and unlabored. Scattered expiratory wheeze. Also adventitious sounds c/w smoking hx. Musculoskeletal: Normal range of motion.  Skin:    General: Skin is warm and dry.     Coloration: Skin is not pale.  Findings: No erythema or rash.  Neurological:     General: No focal deficit present.     Mental Status: He is alert and oriented to person, place, and time.     Coordination: Coordination normal.  Psychiatric:        Behavior: Behavior normal.      ED Treatments / Results  Labs (all labs ordered are listed, but only abnormal results are displayed) Labs Reviewed - No data to display  EKG None  Radiology Dg Chest 2 View  Result Date: 04/26/2018 CLINICAL DATA:  Shortness of breath, cough EXAM: CHEST - 2 VIEW COMPARISON:  None. FINDINGS: There is left lower lobe airspace disease. There are bilateral chronic bronchitic changes. There is no pleural effusion or pneumothorax. The heart and mediastinal contours are  unremarkable. The osseous structures are unremarkable. IMPRESSION: Left lower lobe pneumonia. Electronically Signed   By: Elige Ko   On: 04/26/2018 03:14    Procedures Procedures (including critical care time)  Medications Ordered in ED Medications  acetaminophen (TYLENOL) tablet 650 mg (650 mg Oral Given 04/25/18 2037)  ipratropium-albuterol (DUONEB) 0.5-2.5 (3) MG/3ML nebulizer solution 3 mL (3 mLs Nebulization Given 04/26/18 0246)  levofloxacin (LEVAQUIN) tablet 750 mg (750 mg Oral Given 04/26/18 0432)  ibuprofen (ADVIL,MOTRIN) tablet 800 mg (800 mg Oral Given 04/26/18 0433)  albuterol (PROVENTIL HFA;VENTOLIN HFA) 108 (90 Base) MCG/ACT inhaler 2 puff (2 puffs Inhalation Given 04/26/18 0433)     Initial Impression / Assessment and Plan / ED Course  I have reviewed the triage vital signs and the nursing notes.  Pertinent labs & imaging results that were available during my care of the patient were reviewed by me and considered in my medical decision making (see chart for details).     Patient presents to the emergency department for evaluation of fever in the setting of cough productive of green phlegm and nasal congestion.  Also reporting body aches.  Was febrile in the ED without tachycardia.  Fever improved with antipyretics.  Chest x-ray obtained which shows a left lower lobe pneumonia.  Lung sounds have improved on repeat assessment following a DuoNeb.  Will provide an albuterol inhaler for home use.  Patient started on a 5-day course of Levaquin.  We will also give Tessalon for management of cough.  I have encouraged follow-up with his primary care doctor for recheck.  Return precautions discussed and provided.  Patient discharged in stable condition with no unaddressed concerns.   Vitals:   04/25/18 2012 04/26/18 0124 04/26/18 0436  BP: 135/90 113/74 105/72  Pulse: 97 83 74  Resp: 18 16 16   Temp: (!) 102.4 F (39.1 C) 98.4 F (36.9 C)   TempSrc: Oral Oral   SpO2: 97% 95% 96%     Final Clinical Impressions(s) / ED Diagnoses   Final diagnoses:  Community acquired pneumonia of left lower lobe of lung [facility])    ED Discharge Orders         Ordered    levofloxacin (LEVAQUIN) 750 MG tablet  Daily     04/26/18 0404    benzonatate (TESSALON) 100 MG capsule  3 times daily PRN     04/26/18 0404           Antony Madura, PA-C 04/26/18 0610    Palumbo, April, MD 04/26/18 [phone]

## 2018-04-26 NOTE — Discharge Instructions (Addendum)
Take Levaquin as prescribed until finished.  Continue with use of 600 mg ibuprofen every 6 hours for fever and body aches.  You may supplement this with Tylenol/acetaminophen as needed.  Use 2 puffs of an albuterol inhaler every 4-6 hours for management of chest tightness.  You have been prescribed Tessalon to use for management of cough.  Follow-up with a primary care doctor to ensure that symptoms resolve.  You may return to the ED for new or concerning symptoms.

## 2018-07-20 ENCOUNTER — Encounter (HOSPITAL_COMMUNITY): Payer: Self-pay

## 2018-07-20 ENCOUNTER — Ambulatory Visit (HOSPITAL_COMMUNITY)
Admission: EM | Admit: 2018-07-20 | Discharge: 2018-07-20 | Disposition: A | Discharge status: Home / Self Care | Payer: Self-pay | Attending: Family Medicine | Admitting: Family Medicine

## 2018-07-20 ENCOUNTER — Other Ambulatory Visit: Payer: Self-pay

## 2018-07-20 ENCOUNTER — Ambulatory Visit (INDEPENDENT_AMBULATORY_CARE_PROVIDER_SITE_OTHER): Payer: Self-pay

## 2018-07-20 DIAGNOSIS — K5900 Constipation, unspecified: Secondary | ICD-10-CM

## 2018-07-20 MED ORDER — POLYETHYLENE GLYCOL 3350 17 GM/SCOOP PO POWD: 1.0000 | Freq: Once | ORAL | 0 refills | Status: AC

## 2018-07-20 NOTE — ED Notes (Signed)
Pt states that he never received refill for HCTZ

## 2018-07-20 NOTE — ED Triage Notes (Signed)
Pt states that he has had upset stomach for past 2 days, denies N/V or diarrhea , states that appetite is poor, employer states he is unable to come back to work without being seen .

## 2018-07-20 NOTE — Discharge Instructions (Signed)
Your x-ray revealed significant amount of stool We are treating you for constipation You can do 1 scoop of MiraLAX twice a day until you get a good bowel movement and then you can back down to 1 scoop a day or 1 scoop every other day It is safe for you to go back to work.  There is no concern for coronavirus

## 2018-07-21 NOTE — ED Provider Notes (Signed)
[facility]    CSN: [id number] Arrival date & time: 07/20/18  [id number]     History   Chief Complaint Chief Complaint  Patient presents with  . Abdominal Pain    HPI Jesse Hill is a 38 y.o. male.   Patient is a 38 year old male with past medical history of hypertension.  He presents today with abdominal discomfort that is been waxing and waning over the past 2 days.  Denies any associated nausea, vomiting or diarrhea.  He has had poor appetite.  Sensation of bloating.  Reports he has been having small bowel movements regularly over the past couple days.  Denies any blood in stool.  Denies any rectal pain.  No fevers, chills, body aches, night sweats.  No recent traveling.  No chest pain, shortness of breath.  His employer sent him here for clearance to go back to work. No respiratory symptoms.   ROS per HPI      Past Medical History:  Diagnosis Date  . Hypertension     There are no active problems to display for this patient.   History reviewed. No pertinent surgical history.     Home Medications    Prior to Admission medications   Medication Sig Start Date End Date Taking? Authorizing Provider  benzonatate (TESSALON) 100 MG capsule Take 1 capsule (100 mg total) by mouth 3 (three) times daily as needed for cough. 04/26/18   Antony MaduraHumes, Kelly, PA-C  hydrochlorothiazide (HYDRODIURIL) 25 MG tablet Take 1 tablet (25 mg total) daily by mouth. 01/28/17 02/27/17  Lucia EstelleZheng, Feng, NP  naproxen (NAPROSYN) 500 MG tablet Take 1 tablet (500 mg total) by mouth 2 (two) times daily. 10/18/17   Dietrich PatesKhatri, Hina, PA-C    Family History Family History  Family history unknown: Yes    Social History Social History   Tobacco Use  . Smoking status: Current Some Day Smoker    Packs/day: 0.50    Types: Cigarettes  . Smokeless tobacco: Never Used  Substance Use Topics  . Alcohol use: No    Frequency: Never  . Drug use: No     Allergies   Patient has no known allergies.    Review of Systems Review of Systems   Physical Exam Triage Vital Signs ED Triage Vitals  Enc Vitals Group     BP 07/20/18 0924 (!) 139/98     Pulse Rate 07/20/18 0924 86     Resp --      Temp 07/20/18 0924 97.8 F (36.6 C)     Temp src --      SpO2 07/20/18 0924 98 %     Weight --      Height --      Head Circumference --      Peak Flow --      Pain Score 07/20/18 0926 7     Pain Loc --      Pain Edu? --      Excl. in GC? --    No data found.  Updated Vital Signs BP (!) 139/98   Pulse 86   Temp 97.8 F (36.6 C)   SpO2 98%   Visual Acuity Right Eye Distance:   Left Eye Distance:   Bilateral Distance:    Right Eye Near:   Left Eye Near:    Bilateral Near:     Physical Exam Vitals signs and nursing note reviewed.  Constitutional:      General: He is not in acute distress.  Appearance: Normal appearance. He is well-developed. He is not ill-appearing, toxic-appearing or diaphoretic.  HENT:     Head: Normocephalic and atraumatic.     Nose: Nose normal.  Eyes:     Conjunctiva/sclera: Conjunctivae normal.  Neck:     Musculoskeletal: Normal range of motion.  Cardiovascular:     Rate and Rhythm: Normal rate and regular rhythm.  Pulmonary:     Effort: Pulmonary effort is normal.  Abdominal:     General: Bowel sounds are decreased. There is distension.     Palpations: Abdomen is soft.     Tenderness: There is generalized abdominal tenderness. There is no right CVA tenderness, left CVA tenderness, guarding or rebound.     Hernia: No hernia is present.  Musculoskeletal: Normal range of motion.  Skin:    General: Skin is warm and dry.  Neurological:     Mental Status: He is alert.  Psychiatric:        Mood and Affect: Mood normal.      UC Treatments / Results  Labs (all labs ordered are listed, but only abnormal results are displayed) Labs Reviewed - No data to display  EKG None  Radiology Dg Abd 1 View  Result Date: 07/20/2018 CLINICAL DATA:   Constipation EXAM: ABDOMEN - 1 VIEW COMPARISON:  None. FINDINGS: Large amount of stool in the ascending colon. There is no bowel dilatation to suggest obstruction. There is no evidence of pneumoperitoneum, portal venous gas or pneumatosis. There are no pathologic calcifications along the expected course of the ureters. The osseous structures are unremarkable. IMPRESSION: Large amount of stool in the ascending colon. Electronically Signed   By: Elige Ko   On: 07/20/2018 10:26    Procedures Procedures (including critical care time)  Medications Ordered in UC Medications - No data to display  Initial Impression / Assessment and Plan / UC Course  I have reviewed the triage vital signs and the nursing notes.  Pertinent labs & imaging results that were available during my care of the patient were reviewed by me and considered in my medical decision making (see chart for details).    Constipation-  Symptoms, exam and xray consistent with constipation Treating with miralax.  Follow up as needed for continued or worsening symptoms Safe to return to work.   Final Clinical Impressions(s) / UC Diagnoses   Final diagnoses:  Constipation, unspecified constipation type     Discharge Instructions     Your x-ray revealed significant amount of stool We are treating you for constipation You can do 1 scoop of MiraLAX twice a day until you get a good bowel movement and then you can back down to 1 scoop a day or 1 scoop every other day It is safe for you to go back to work.  There is no concern for coronavirus    ED Prescriptions    Medication Sig Dispense Auth. Provider   polyethylene glycol powder (GLYCOLAX/MIRALAX) 17 GM/SCOOP powder Take 255 g by mouth once for 1 dose. 255 g Dahlia Byes A, NP     Controlled Substance Prescriptions [place] Controlled Substance Registry consulted? Not Applicable   Janace Aris, NP 07/21/18 [phone]

## 2018-11-02 ENCOUNTER — Other Ambulatory Visit: Payer: Self-pay

## 2018-11-02 ENCOUNTER — Encounter (HOSPITAL_COMMUNITY): Payer: Self-pay | Admitting: Emergency Medicine

## 2018-11-02 ENCOUNTER — Ambulatory Visit (HOSPITAL_COMMUNITY)
Admission: EM | Admit: 2018-11-02 | Discharge: 2018-11-02 | Disposition: A | Discharge status: Home / Self Care | Payer: Self-pay | Attending: Family Medicine | Admitting: Family Medicine

## 2018-11-02 DIAGNOSIS — I10 Essential (primary) hypertension: Secondary | ICD-10-CM

## 2018-11-02 DIAGNOSIS — L309 Dermatitis, unspecified: Secondary | ICD-10-CM

## 2018-11-02 MED ORDER — TRIAMCINOLONE ACETONIDE 0.1 % EX CREA: 1.0000 "application " | TOPICAL_CREAM | Freq: Two times a day (BID) | CUTANEOUS | 0 refills | Status: AC

## 2018-11-02 MED ORDER — PREDNISONE 20 MG PO TABS: ORAL_TABLET | ORAL | 0 refills | Status: AC

## 2018-11-02 MED ORDER — METHYLPREDNISOLONE SODIUM SUCC 125 MG IJ SOLR
80.0000 mg | Freq: Once | INTRAMUSCULAR | Status: AC
  Administered 2018-11-02: 80 mg via INTRAMUSCULAR

## 2018-11-02 MED ORDER — METHYLPREDNISOLONE SODIUM SUCC 125 MG IJ SOLR
INTRAMUSCULAR | Status: AC
  Filled 2018-11-02: qty 2

## 2018-11-02 MED ORDER — HYDROCHLOROTHIAZIDE 25 MG PO TABS: 25.0000 mg | ORAL_TABLET | Freq: Every day | ORAL | 1 refills | Status: AC

## 2018-11-02 NOTE — ED Provider Notes (Signed)
[facility] [place]    CSN: [id number] Arrival date & time: 11/02/18  1728      History   Chief Complaint Chief Complaint  Patient presents with  . Rash    HPI Jesse Hill is a 38 y.o. male.   HPI  Patient has a long history of eczema.  His eczema right now is broken out "all over".  Is on his face.  He has facial swelling.  Both arms both legs.  Chest and back.  He thinks it is because of stress.  He thinks it might be because of heat.  He is currently unable to work because of coronavirus. Because he is not working he has not afforded his medical visits of blood pressure medication.  His blood pressure is elevated.  He has no headaches or dizzy spells.  He would like a refill of his blood pressure medication. He knows to use mild soap.  He knows he supposed to use a lot of lotion.  Lately he has been trying to use lotion and Aquaphor but it is not helping.  He states he does have a lot of itching.   Past Medical History:  Diagnosis Date  . Hypertension     There are no active problems to display for this patient.   History reviewed. No pertinent surgical history.     Home Medications    Prior to Admission medications   Medication Sig Start Date End Date Taking? Authorizing Provider  hydrochlorothiazide (HYDRODIURIL) 25 MG tablet Take 1 tablet (25 mg total) by mouth daily. 11/02/18 01/01/19  Raylene Everts, MD  predniSONE (DELTASONE) 20 MG tablet Take 1 pill 2 times a day for 5 days then 1 pill once a day for 5 days 11/02/18   Raylene Everts, MD  triamcinolone cream (KENALOG) 0.1 % Apply 1 application topically 2 (two) times daily. 11/02/18   Raylene Everts, MD    Family History Family History  Family history unknown: Yes    Social History Social History   Tobacco Use  . Smoking status: Current Some Day Smoker    Packs/day: 0.50    Types: Cigarettes  . Smokeless tobacco: Never Used  Substance Use Topics  . Alcohol use: No    Frequency:  Never  . Drug use: No     Allergies   Patient has no known allergies.   Review of Systems Review of Systems  Constitutional: Negative for chills and fever.  HENT: Negative for ear pain and sore throat.   Eyes: Negative for pain and visual disturbance.  Respiratory: Negative for cough and shortness of breath.   Cardiovascular: Negative for chest pain and palpitations.  Gastrointestinal: Negative for abdominal pain and vomiting.  Genitourinary: Negative for dysuria and hematuria.  Musculoskeletal: Negative for arthralgias and back pain.  Skin: Positive for rash. Negative for color change.  Neurological: Negative for seizures and syncope.  All other systems reviewed and are negative.    Physical Exam Triage Vital Signs ED Triage Vitals  Enc Vitals Group     BP 11/02/18 1754 (!) 164/96     Pulse Rate 11/02/18 1754 92     Resp 11/02/18 1754 18     Temp 11/02/18 1754 97.8 F (36.6 C)     Temp Source 11/02/18 1754 Oral     SpO2 11/02/18 1754 98 %     Weight --      Height --      Head Circumference --  Peak Flow --      Pain Score 11/02/18 1755 0     Pain Loc --      Pain Edu? --      Excl. in GC? --    No data found.  Updated Vital Signs BP (!) 164/96 (BP Location: Left Arm)   Pulse 92   Temp 97.8 F (36.6 C) (Oral)   Resp 18   SpO2 98%   Visual Acuity Right Eye Distance:   Left Eye Distance:   Bilateral Distance:    Right Eye Near:   Left Eye Near:    Bilateral Near:     Physical Exam Constitutional:      General: He is not in acute distress.    Appearance: He is well-developed and normal weight.  HENT:     Head: Normocephalic and atraumatic.     Right Ear: Tympanic membrane normal.     Left Ear: Tympanic membrane normal.     Ears:     Comments: Pinnae and canals have eczema    Nose: Nose normal. No congestion.     Mouth/Throat:     Mouth: Mucous membranes are moist.  Eyes:     Conjunctiva/sclera: Conjunctivae normal.     Pupils: Pupils  are equal, round, and reactive to light.  Neck:     Musculoskeletal: Normal range of motion.  Cardiovascular:     Rate and Rhythm: Normal rate.  Pulmonary:     Effort: Pulmonary effort is normal. No respiratory distress.  Abdominal:     General: There is no distension.     Palpations: Abdomen is soft.  Musculoskeletal: Normal range of motion.  Skin:    General: Skin is warm and dry.     Comments: As described the patient has papular rash some with vesicles on face arms legs chest and back.  On the face there is some peeling and crusting.  The forearm lesions are grossly papules with some excoriation.  No evidence of secondary infection.  Neurological:     General: No focal deficit present.     Mental Status: He is alert.  Psychiatric:        Mood and Affect: Mood normal.        Behavior: Behavior normal.      UC Treatments / Results  Labs (all labs ordered are listed, but only abnormal results are displayed) Labs Reviewed - No data to display  EKG   Radiology No results found.  Procedures Procedures (including critical care time)  Medications Ordered in UC Medications  methylPREDNISolone sodium succinate (SOLU-MEDROL) 125 mg/2 mL injection 80 mg (has no administration in time range)    Initial Impression / Assessment and Plan / UC Course  I have reviewed the triage vital signs and the nursing notes.  Pertinent labs & imaging results that were available during my care of the patient were reviewed by me and considered in my medical decision making (see chart for details).     I talked to him about eczema management in general.  Recommend trying to get a PCP for ongoing medical refills. Final Clinical Impressions(s) / UC Diagnoses   Final diagnoses:  Eczema, unspecified type     Discharge Instructions     Take prednisone 2 times a day.  Start this tomorrow.  You received a shot today Use the cream 2 times a day.)  Rub in to all to areas that have a rash  Always use mild soap and lots of lotion  ED Prescriptions    Medication Sig Dispense Auth. Provider   hydrochlorothiazide (HYDRODIURIL) 25 MG tablet Take 1 tablet (25 mg total) by mouth daily. 30 tablet Eustace MooreNelson, Rebeccah Ivins Sue, MD   triamcinolone cream (KENALOG) 0.1 % Apply 1 application topically 2 (two) times daily. 30 g Eustace MooreNelson, Anneli Bing Sue, MD   predniSONE (DELTASONE) 20 MG tablet Take 1 pill 2 times a day for 5 days then 1 pill once a day for 5 days 15 tablet Eustace MooreNelson, Lanai Conlee Sue, MD     Controlled Substance Prescriptions [place] Controlled Substance Registry consulted? Not Applicable   Eustace MooreNelson, Anis Cinelli Sue, MD 11/02/18 [id number] [id number]

## 2018-11-02 NOTE — ED Triage Notes (Signed)
Pt here for rash to arms from eczema

## 2018-11-02 NOTE — Discharge Instructions (Addendum)
Take prednisone 2 times a day.  Start this tomorrow.  You received a shot today Use the cream 2 times a day.)  Rub in to all to areas that have a rash Always use mild soap and lots of lotion

## 2019-03-15 ENCOUNTER — Ambulatory Visit (HOSPITAL_COMMUNITY)
Admission: EM | Admit: 2019-03-15 | Discharge: 2019-03-15 | Disposition: A | Discharge status: Home / Self Care | Payer: Self-pay | Attending: Family Medicine | Admitting: Family Medicine

## 2019-03-15 ENCOUNTER — Encounter (HOSPITAL_COMMUNITY): Payer: Self-pay | Admitting: Emergency Medicine

## 2019-03-15 ENCOUNTER — Other Ambulatory Visit: Payer: Self-pay

## 2019-03-15 DIAGNOSIS — Z202 Contact with and (suspected) exposure to infections with a predominantly sexual mode of transmission: Secondary | ICD-10-CM | POA: Insufficient documentation

## 2019-03-15 DIAGNOSIS — I10 Essential (primary) hypertension: Secondary | ICD-10-CM

## 2019-03-15 MED ORDER — AZITHROMYCIN 250 MG PO TABS
1000.0000 mg | ORAL_TABLET | Freq: Once | ORAL | Status: AC
  Administered 2019-03-15: 1000 mg via ORAL

## 2019-03-15 MED ORDER — AZITHROMYCIN 250 MG PO TABS
ORAL_TABLET | ORAL | Status: AC
  Filled 2019-03-15: qty 4

## 2019-03-15 NOTE — Discharge Instructions (Addendum)
Test result should be back on Saturday.  We'll call you if positive.  Otherwise, you should be okay.

## 2019-03-15 NOTE — ED Provider Notes (Signed)
[facility]    CSN: [id number] Arrival date & time: 03/15/19  1043      History   Chief Complaint Chief Complaint  Patient presents with  . Exposure to STD    HPI Jesse Hill is a 38 y.o. male.   Established [facility] patient  Pt here for STD check ... reports new partner  Asymptomatic but has been having some groin discomfort  Denies penile d/c, fevers, uti sx  A&O x4... NAD.Marland Kitchen. ambulatory        Past Medical History:  Diagnosis Date  . Hypertension     There are no problems to display for this patient.   History reviewed. No pertinent surgical history.     Home Medications    Prior to Admission medications   Medication Sig Start Date End Date Taking? Authorizing Provider  hydrochlorothiazide (HYDRODIURIL) 25 MG tablet Take 1 tablet (25 mg total) by mouth daily. 11/02/18 01/01/19  Eustace Moore, MD  predniSONE (DELTASONE) 20 MG tablet Take 1 pill 2 times a day for 5 days then 1 pill once a day for 5 days 11/02/18   Eustace Moore, MD  triamcinolone cream (KENALOG) 0.1 % Apply 1 application topically 2 (two) times daily. 11/02/18   Eustace Moore, MD    Family History Family History  Family history unknown: Yes    Social History Social History   Tobacco Use  . Smoking status: Current Some Day Smoker    Packs/day: 0.50    Types: Cigarettes  . Smokeless tobacco: Never Used  Substance Use Topics  . Alcohol use: No  . Drug use: No     Allergies   Patient has no known allergies.   Review of Systems Review of Systems   Physical Exam Triage Vital Signs ED Triage Vitals  Enc Vitals Group     BP 03/15/19 1106 (!) 144/96     Pulse Rate 03/15/19 1106 84     Resp 03/15/19 1106 14     Temp 03/15/19 1106 98.1 F (36.7 C)     Temp Source 03/15/19 1106 Oral     SpO2 03/15/19 1106 100 %     Weight --      Height --      Head Circumference --      Peak Flow --      Pain Score 03/15/19 1104 0     Pain Loc --    Pain Edu? --      Excl. in GC? --    No data found.  Updated Vital Signs BP (!) 144/96 (BP Location: Right Arm)   Pulse 84   Temp 98.1 F (36.7 C) (Oral)   Resp 14   SpO2 100%    Physical Exam Vitals and nursing note reviewed.  Constitutional:      General: He is not in acute distress.    Appearance: Normal appearance. He is normal weight. He is not ill-appearing or toxic-appearing.  Eyes:     Conjunctiva/sclera: Conjunctivae normal.  Pulmonary:     Effort: Pulmonary effort is normal.  Genitourinary:    Penis: Normal.   Musculoskeletal:        General: Normal range of motion.  Skin:    General: Skin is warm and dry.  Neurological:     General: No focal deficit present.     Mental Status: He is alert.  Psychiatric:        Mood and Affect: Mood normal.  UC Treatments / Results  Labs (all labs ordered are listed, but only abnormal results are displayed) Labs Reviewed  CYTOLOGY, (ORAL, ANAL, URETHRAL) ANCILLARY ONLY    EKG   Radiology No results found.  Procedures Procedures (including critical care time)  Medications Ordered in UC Medications  azithromycin (ZITHROMAX) tablet 1,000 mg (has no administration in time range)    Initial Impression / Assessment and Plan / UC Course  I have reviewed the triage vital signs and the nursing notes.  Pertinent labs & imaging results that were available during my care of the patient were reviewed by me and considered in my medical decision making (see chart for details).    Final Clinical Impressions(s) / UC Diagnoses   Final diagnoses:  Exposure to STD     Discharge Instructions     Test result should be back on Saturday.  We'll call you if positive.  Otherwise, you should be okay.    ED Prescriptions    None     I have reviewed the PDMP during this encounter.   Robyn Haber, MD 03/15/19 1122

## 2019-03-15 NOTE — ED Triage Notes (Signed)
Pt here for STD check ... reports new partner  Asymptomatic but has been having some groin discomfort  Denies penile d/c, fevers, uti sx  A&O x4... NAD.Marland Kitchen. ambulatory

## 2019-03-20 LAB — CYTOLOGY, (ORAL, ANAL, URETHRAL) ANCILLARY ONLY
Chlamydia: NEGATIVE
Neisseria Gonorrhea: NEGATIVE
Trichomonas: NEGATIVE

## 2020-05-02 IMAGING — CR DG CHEST 2V
2 series · 2 of 2 positions shown · non-contrast
Comparison: None.

CLINICAL DATA: Shortness of breath, cough

EXAM:
CHEST - 2 VIEW

[chest pa]
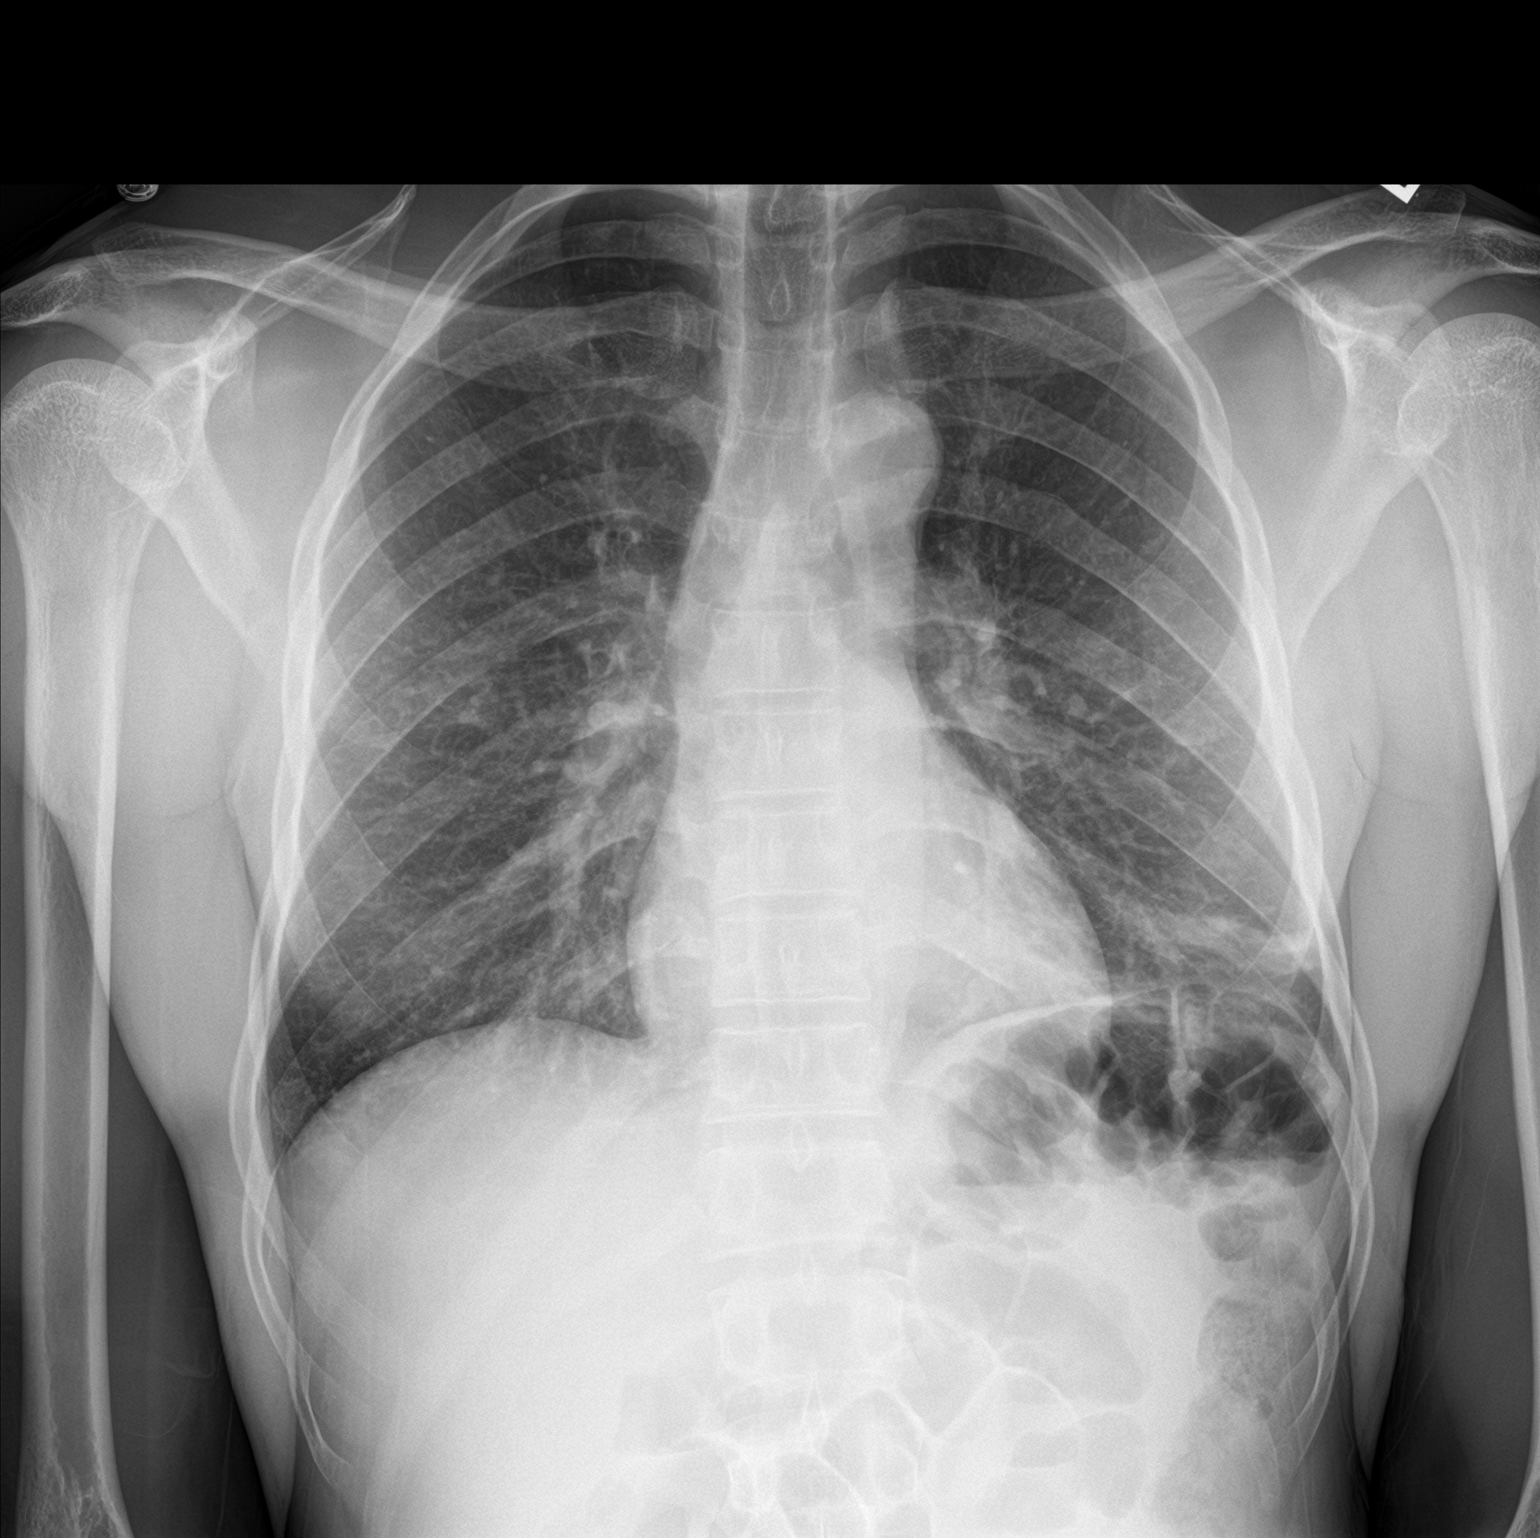

[chest lat]
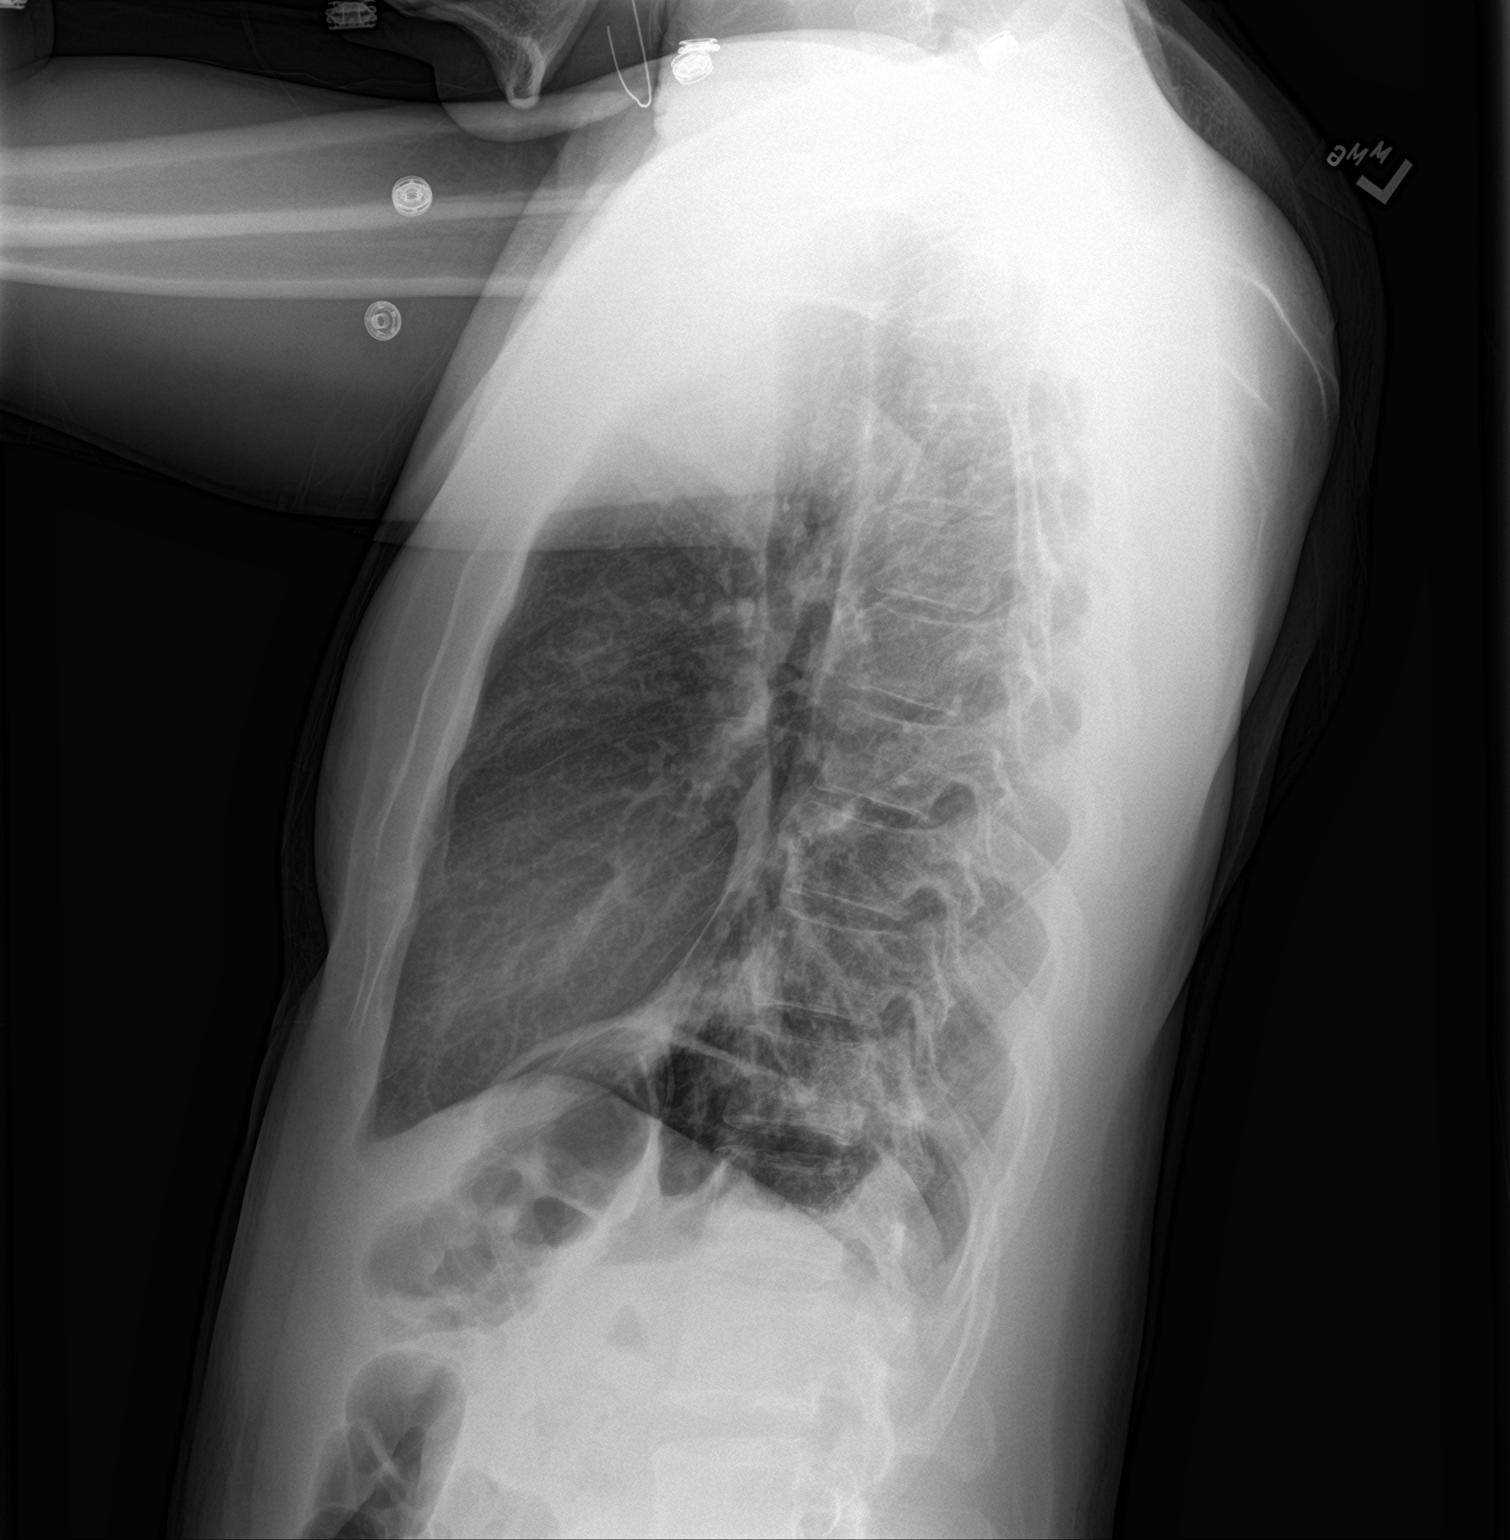

[2 of 2 positions shown; findings below may reference images not displayed]

FINDINGS: There is left lower lobe airspace disease. There are bilateral
chronic bronchitic changes. There is no pleural effusion or
pneumothorax. The heart and mediastinal contours are unremarkable.

The osseous structures are unremarkable.
IMPRESSION: Left lower lobe pneumonia.

## 2024-04-06 ENCOUNTER — Other Ambulatory Visit: Payer: Self-pay

## 2024-04-06 ENCOUNTER — Emergency Department (HOSPITAL_COMMUNITY)
Admission: EM | Admit: 2024-04-06 | Discharge: 2024-04-07 | Disposition: A | Discharge status: Home / Self Care | Payer: Self-pay

## 2024-04-06 ENCOUNTER — Emergency Department (HOSPITAL_COMMUNITY): Payer: Self-pay

## 2024-04-06 ENCOUNTER — Encounter (HOSPITAL_COMMUNITY): Payer: Self-pay | Admitting: Emergency Medicine

## 2024-04-06 DIAGNOSIS — R519 Headache, unspecified: Secondary | ICD-10-CM | POA: Insufficient documentation

## 2024-04-06 DIAGNOSIS — H6691 Otitis media, unspecified, right ear: Secondary | ICD-10-CM | POA: Insufficient documentation

## 2024-04-06 DIAGNOSIS — H6690 Otitis media, unspecified, unspecified ear: Secondary | ICD-10-CM

## 2024-04-06 NOTE — ED Triage Notes (Signed)
 Pt c/o ear pain for about a week, thinks he may have an ear infection, also reports a migraine.

## 2024-04-06 NOTE — ED Notes (Signed)
 Patient coming to ED for evaluation of migraine symptoms.  Reports symptoms started 10 days ago.  Has had cough and R ear pain x 3 weeks.  States he feels that migraine was a result of cough and ear pain.  No report of fever.  No cough noted during triage

## 2024-04-07 MED ORDER — AMOXICILLIN 500 MG PO CAPS: 500.0000 mg | ORAL_CAPSULE | Freq: Two times a day (BID) | ORAL | 0 refills | Status: AC

## 2024-04-07 MED ORDER — AMOXICILLIN 500 MG PO CAPS
500.0000 mg | ORAL_CAPSULE | Freq: Once | ORAL | Status: AC
  Administered 2024-04-07: 500 mg via ORAL
  Filled 2024-04-07: qty 1

## 2024-04-07 MED ORDER — DOCUSATE SODIUM 50 MG/5ML PO LIQD
50.0000 mg | Freq: Once | ORAL | Status: AC
  Administered 2024-04-07: 50 mg via OTIC
  Filled 2024-04-07: qty 10

## 2024-04-07 NOTE — ED Provider Notes (Signed)
 " [place] EMERGENCY DEPARTMENT AT [facility] Provider Note   CSN: [id number] Arrival date & time: 04/06/24  1740     Patient presents with: Migraine   Jesse Hill is a 44 y.o. male.  Presents today for R ear pain x 1 week and headache.  Patient denies nausea, vomiting, fever, chills, numbness, weakness, diplopia, tinnitus, photophobia, or phonophobia.  Patient does report a upper respiratory illness in the last week or 2 but states most of his other symptoms have resolved.    Migraine Associated symptoms include headaches.       Prior to Admission medications  Medication Sig Start Date End Date Taking? Authorizing Provider  amoxicillin  (AMOXIL ) 500 MG capsule Take 1 capsule (500 mg total) by mouth 2 (two) times daily. 04/07/24  Yes Adelin Ventrella N, PA-C  hydrochlorothiazide  (HYDRODIURIL ) 25 MG tablet Take 1 tablet (25 mg total) by mouth daily. 11/02/18 01/01/19  Maranda Jamee Jacob, MD  predniSONE  (DELTASONE ) 20 MG tablet Take 1 pill 2 times a day for 5 days then 1 pill once a day for 5 days 11/02/18   Maranda Jamee Jacob, MD  triamcinolone  cream (KENALOG ) 0.1 % Apply 1 application topically 2 (two) times daily. 11/02/18   Maranda Jamee Jacob, MD    Allergies: Patient has no known allergies.    Review of Systems  HENT:  Positive for ear pain.   Neurological:  Positive for headaches.    Updated Vital Signs BP (!) 131/91 (BP Location: Left Arm)   Pulse 63   Temp 97.7 F (36.5 C) (Oral)   Resp 18   Ht 6' 1 (1.854 m)   Wt 81.6 kg   SpO2 100%   BMI 23.75 kg/m   Physical Exam Vitals and nursing note reviewed.  Constitutional:      General: He is not in acute distress.    Appearance: He is well-developed. He is not toxic-appearing.  HENT:     Head: Normocephalic and atraumatic.     Right Ear: There is impacted cerumen.     Left Ear: Tympanic membrane normal.     Nose: Congestion present. No rhinorrhea.  Eyes:     Conjunctiva/sclera: Conjunctivae normal.   Cardiovascular:     Rate and Rhythm: Normal rate and regular rhythm.     Heart sounds: No murmur heard. Pulmonary:     Effort: Pulmonary effort is normal. No respiratory distress.  Abdominal:     Palpations: Abdomen is soft.     Tenderness: There is no abdominal tenderness.  Musculoskeletal:        General: No swelling.     Cervical back: Neck supple.  Skin:    General: Skin is warm and dry.     Capillary Refill: Capillary refill takes less than 2 seconds.  Neurological:     General: No focal deficit present.     Mental Status: He is alert and oriented to person, place, and time.  Psychiatric:        Mood and Affect: Mood normal.     (all labs ordered are listed, but only abnormal results are displayed) Labs Reviewed - No data to display  EKG: None  Radiology: CT Head Wo Contrast Result Date: 04/06/2024 EXAM: CT HEAD WITHOUT CONTRAST 04/06/2024 08:52:41 PM TECHNIQUE: CT of the head was performed without the administration of intravenous contrast. Automated exposure control, iterative reconstruction, and/or weight based adjustment of the mA/kV was utilized to reduce the radiation dose to as low as reasonably achievable. COMPARISON:  None available. CLINICAL HISTORY: Headache, increasing frequency or severity FINDINGS: BRAIN AND VENTRICLES: No acute hemorrhage. No evidence of acute infarct. No hydrocephalus. No extra-axial collection. No mass effect or midline shift. ORBITS: No acute abnormality. SINUSES: No acute abnormality. SOFT TISSUES AND SKULL: No acute soft tissue abnormality. No skull fracture. IMPRESSION: 1. No acute intracranial abnormality. Electronically signed by: Donnice Mania MD 04/06/2024 09:01 PM EST RP Workstation: [id number]     Procedures   Medications Ordered in the ED  amoxicillin  (AMOXIL ) capsule 500 mg (has no administration in time range)  docusate (COLACE) 50 MG/5ML liquid 50 mg (50 mg Right EAR Given 04/07/24 0101)                                     Medical Decision Making Amount and/or Complexity of Data Reviewed Radiology: ordered.  Risk OTC drugs. Prescription drug management.   This patient presents to the ED for concern of headache and ear pain differential diagnosis includes sinusitis, acute otitis media, acute otitis externa, viral URI  Imaging Studies ordered:  I ordered imaging studies including CT head Noncon I independently visualized and interpreted imaging which showed negative I agree with the radiologist interpretation   Medicines ordered and prescription drug management:  I ordered medication including Colace and amoxicillin     I have reviewed the patients home medicines and have made adjustments as needed   Problem List / ED Course:  Examination of the right ear canal after earwax removal revealed erythematous TM consistent with acute otitis media. Consider for admission or further workup however patient's vital signs, physical exam, and imaging are reassuring.  Patient's symptoms likely due to acute otitis media of the right ear.  Patient given outpatient treatment with amoxicillin .  Patient given return precautions.  I feel patient safer discharge at this time.       Final diagnoses:  Acute otitis media, unspecified otitis media type    ED Discharge Orders          Ordered    amoxicillin  (AMOXIL ) 500 MG capsule  2 times daily        04/07/24 0305               Francis Ileana SAILOR, PA-C 04/07/24 [id number]    Simon Lavonia SAILOR, MD 04/07/24 [phone]  "

## 2024-04-07 NOTE — Discharge Instructions (Signed)
 Today you were seen for acute otitis media of your right ear.  Please pick up your antibiotic and take as prescribed.  Thank you for letting us  treat you today. After performing a physical exam and reviewing your imaging, I feel you are safe to go home. Please follow up with your PCP in the next several days and provide them with your records from this visit. Return to the Emergency Room if pain becomes severe or symptoms worsen.

## 2024-04-07 NOTE — ED Notes (Signed)
 RN attempted earwax removal without success. Colace administered and will assess shortly.

## 2024-04-07 NOTE — ED Notes (Signed)
 Pt states he initially had an infection about a month ago that has progressed into a migraine over the last week. Pt states he has been treating at home with Tylenol  without relief.   Pt states it has been hard to rest.
